# Patient Record
Sex: Female | Born: 1980 | Race: Black or African American | Hispanic: No | Marital: Single | Smoking: Never smoker
Health system: Southern US, Community
[De-identification: ages and names within clinical notes are randomized; demographics above are authoritative.]

## PROBLEM LIST (undated history)

## (undated) DIAGNOSIS — R7303 Prediabetes: Secondary | ICD-10-CM

## (undated) DIAGNOSIS — Z9109 Other allergy status, other than to drugs and biological substances: Secondary | ICD-10-CM

## (undated) DIAGNOSIS — Z8489 Family history of other specified conditions: Secondary | ICD-10-CM

## (undated) DIAGNOSIS — J45909 Unspecified asthma, uncomplicated: Secondary | ICD-10-CM

## (undated) DIAGNOSIS — T8859XA Other complications of anesthesia, initial encounter: Secondary | ICD-10-CM

## (undated) DIAGNOSIS — K219 Gastro-esophageal reflux disease without esophagitis: Secondary | ICD-10-CM

## (undated) DIAGNOSIS — I8392 Asymptomatic varicose veins of left lower extremity: Secondary | ICD-10-CM

## (undated) DIAGNOSIS — L309 Dermatitis, unspecified: Secondary | ICD-10-CM

## (undated) DIAGNOSIS — M1990 Unspecified osteoarthritis, unspecified site: Secondary | ICD-10-CM

## (undated) DIAGNOSIS — T4145XA Adverse effect of unspecified anesthetic, initial encounter: Secondary | ICD-10-CM

---

## 2002-08-26 ENCOUNTER — Emergency Department (HOSPITAL_COMMUNITY): Admission: EM | Admit: 2002-08-26 | Discharge: 2002-08-26 | Payer: Self-pay | Admitting: Emergency Medicine

## 2010-05-27 ENCOUNTER — Encounter: Admission: RE | Admit: 2010-05-27 | Discharge: 2010-05-27 | Payer: Self-pay | Admitting: Obstetrics and Gynecology

## 2010-09-28 HISTORY — PX: CERVICAL BIOPSY  W/ LOOP ELECTRODE EXCISION: SUR135

## 2010-10-14 ENCOUNTER — Ambulatory Visit (HOSPITAL_COMMUNITY): Admission: RE | Admit: 2010-10-14 | Discharge: 2010-10-14 | Payer: Self-pay | Admitting: Obstetrics and Gynecology

## 2011-02-09 LAB — CBC
HCT: 38.2 % (ref 36.0–46.0)
Hemoglobin: 13.1 g/dL (ref 12.0–15.0)
MCH: 30.1 pg (ref 26.0–34.0)
MCV: 87.8 fL (ref 78.0–100.0)
RDW: 13.4 % (ref 11.5–15.5)
WBC: 2.5 10*3/uL — ABNORMAL LOW (ref 4.0–10.5)

## 2012-06-01 ENCOUNTER — Other Ambulatory Visit: Payer: Self-pay | Admitting: Obstetrics and Gynecology

## 2012-12-31 ENCOUNTER — Encounter: Payer: Self-pay | Admitting: Obstetrics and Gynecology

## 2012-12-31 ENCOUNTER — Ambulatory Visit: Payer: BC Managed Care – PPO | Admitting: Obstetrics and Gynecology

## 2012-12-31 VITALS — BP 118/80 | Resp 14 | Ht 68.5 in | Wt 342.0 lb

## 2012-12-31 DIAGNOSIS — Z124 Encounter for screening for malignant neoplasm of cervix: Secondary | ICD-10-CM

## 2012-12-31 DIAGNOSIS — Z3040 Encounter for surveillance of contraceptives, unspecified: Secondary | ICD-10-CM

## 2012-12-31 DIAGNOSIS — Z01419 Encounter for gynecological examination (general) (routine) without abnormal findings: Secondary | ICD-10-CM

## 2012-12-31 DIAGNOSIS — N915 Oligomenorrhea, unspecified: Secondary | ICD-10-CM

## 2012-12-31 DIAGNOSIS — N926 Irregular menstruation, unspecified: Secondary | ICD-10-CM

## 2012-12-31 DIAGNOSIS — N898 Other specified noninflammatory disorders of vagina: Secondary | ICD-10-CM

## 2012-12-31 LAB — POCT WET PREP (WET MOUNT)

## 2012-12-31 LAB — POCT URINE PREGNANCY: Preg Test, Ur: NEGATIVE

## 2012-12-31 MED ORDER — VALACYCLOVIR HCL 500 MG PO TABS: 500.0000 mg | ORAL_TABLET | Freq: Two times a day (BID) | ORAL | Status: AC

## 2012-12-31 NOTE — Addendum Note (Signed)
Addended by: Marla Roe A on: 12/31/2012 04:09 PM   Modules accepted: Orders

## 2012-12-31 NOTE — Addendum Note (Signed)
Addended by: Marla Roe A on: 12/31/2012 03:59 PM   Modules accepted: Orders

## 2012-12-31 NOTE — Progress Notes (Signed)
Patient ID: Martha Dominguez, female   DOB: 1981-01-10, 32 y.o.   MRN: [id number] Contraception None Last pap 6 mos ago per pt./ will verify with paper chart (10/2011 pap wnl.)  Last Mammo never Last Colonoscopy never Last Dexa Scan never Primary MD Cannot recall name Abuse at Home none  C/o ? Developing yeast infxn has been on abx for ?folliculitis.  Says irritation is related to soap - rec change.  Filed Vitals:   12/31/12 1449  BP: 118/80  Resp: 14   ROS: noncontributory  Physical Examination: General appearance - alert, well appearing, and in no distress Neck - supple, no significant adenopathy Chest - clear to auscultation, no wheezes, rales or rhonchi, symmetric air entry Heart - normal rate and regular rhythm Abdomen - soft, nontender, nondistended, no masses or organomegaly Breasts - breasts appear normal, no suspicious masses, no skin or nipple changes or axillary nodes Pelvic - normal external genitalia, vulva, vagina, cervix, uterus and adnexa, difficult secondary to habitus Back exam - no CVAT Extremities - no edema, redness or tenderness in the calves or thighs  A/P Morbidly obese - wt loss Wet prep - neg except inc ph - trial of rephresh Irregular cycle - sched u/s to reeval em lining - previous nl w/u per pt - however 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

## 2013-01-02 LAB — PAP IG, CT-NG, RFX HPV ASCU
Chlamydia Probe Amp: NEGATIVE
GC Probe Amp: NEGATIVE

## 2013-01-07 ENCOUNTER — Encounter: Payer: Self-pay | Admitting: Obstetrics and Gynecology

## 2013-02-11 ENCOUNTER — Ambulatory Visit: Payer: BC Managed Care – PPO

## 2013-02-11 ENCOUNTER — Other Ambulatory Visit: Payer: Self-pay | Admitting: Obstetrics and Gynecology

## 2013-02-11 ENCOUNTER — Encounter: Payer: Self-pay | Admitting: Obstetrics and Gynecology

## 2013-02-11 ENCOUNTER — Ambulatory Visit: Payer: BC Managed Care – PPO | Admitting: Obstetrics and Gynecology

## 2013-02-11 VITALS — BP 110/82 | Wt 341.0 lb

## 2013-02-11 LAB — CBC
Platelets: 293 10*3/uL (ref 150–400)
RBC: 4.41 MIL/uL (ref 3.87–5.11)
WBC: 5 10*3/uL (ref 4.0–10.5)

## 2013-02-11 MED ORDER — LEVONORGESTREL 13.5 MG IU IUD
INTRAUTERINE_SYSTEM | Freq: Once | INTRAUTERINE | Status: AC
  Administered 2013-02-11: 1 via INTRAUTERINE

## 2013-02-11 NOTE — Progress Notes (Signed)
IUD: Skyla UPT: negative GC/CHLAMYDIA: negative CONSENT SIGNED: yes DISINFECTION WITH betadine X3 UTERUS SOUNDED AT 7.5 CM IUD INSERTED PER PROTOCOL: yes COMPLICATION: no PATIENT INSTRUCTED TO CALL IS FEVER OR ABNORMAL PAIN:yes PATIENT INSTRUCTED ON HOW TO CHECK IUD STRINGS: yes FOLLOW UP APPT: 5wks  U/S - Ut 2.9 x 5.5 x 3.9, nl bilateral ovaries lt 2.2cm, rt 2.6cm  Check TSH, PRL and CBC secondary to menometrorrhagia

## 2017-07-04 ENCOUNTER — Other Ambulatory Visit: Payer: Self-pay | Admitting: Dermatology

## 2017-09-03 ENCOUNTER — Emergency Department (HOSPITAL_COMMUNITY)
Admission: EM | Admit: 2017-09-03 | Discharge: 2017-09-03 | Disposition: A | Discharge status: Home / Self Care | Payer: BLUE CROSS/BLUE SHIELD | Attending: Emergency Medicine | Admitting: Emergency Medicine

## 2017-09-03 ENCOUNTER — Emergency Department (HOSPITAL_COMMUNITY): Payer: BLUE CROSS/BLUE SHIELD

## 2017-09-03 ENCOUNTER — Encounter (HOSPITAL_COMMUNITY): Payer: Self-pay | Admitting: Emergency Medicine

## 2017-09-03 DIAGNOSIS — Y999 Unspecified external cause status: Secondary | ICD-10-CM | POA: Insufficient documentation

## 2017-09-03 DIAGNOSIS — R55 Syncope and collapse: Secondary | ICD-10-CM | POA: Insufficient documentation

## 2017-09-03 DIAGNOSIS — Y92002 Bathroom of unspecified non-institutional (private) residence single-family (private) house as the place of occurrence of the external cause: Secondary | ICD-10-CM | POA: Insufficient documentation

## 2017-09-03 DIAGNOSIS — S52501A Unspecified fracture of the lower end of right radius, initial encounter for closed fracture: Secondary | ICD-10-CM | POA: Insufficient documentation

## 2017-09-03 DIAGNOSIS — W182XXA Fall in (into) shower or empty bathtub, initial encounter: Secondary | ICD-10-CM | POA: Insufficient documentation

## 2017-09-03 DIAGNOSIS — S6991XA Unspecified injury of right wrist, hand and finger(s), initial encounter: Secondary | ICD-10-CM | POA: Diagnosis present

## 2017-09-03 DIAGNOSIS — Y93E1 Activity, personal bathing and showering: Secondary | ICD-10-CM | POA: Insufficient documentation

## 2017-09-03 LAB — CBC WITH DIFFERENTIAL/PLATELET
BASOS ABS: 0 10*3/uL (ref 0.0–0.1)
BASOS PCT: 0 %
EOS ABS: 0 10*3/uL (ref 0.0–0.7)
EOS PCT: 1 %
HCT: 39.2 % (ref 36.0–46.0)
Hemoglobin: 13.1 g/dL (ref 12.0–15.0)
LYMPHS PCT: 30 %
Lymphs Abs: 1.2 10*3/uL (ref 0.7–4.0)
MCH: 30.3 pg (ref 26.0–34.0)
MCHC: 33.4 g/dL (ref 30.0–36.0)
MCV: 90.7 fL (ref 78.0–100.0)
MONO ABS: 0.2 10*3/uL (ref 0.1–1.0)
Monocytes Relative: 5 %
Neutro Abs: 2.6 10*3/uL (ref 1.7–7.7)
Neutrophils Relative %: 64 %
PLATELETS: 225 10*3/uL (ref 150–400)
RBC: 4.32 MIL/uL (ref 3.87–5.11)
RDW: 12.8 % (ref 11.5–15.5)
WBC: 4.1 10*3/uL (ref 4.0–10.5)

## 2017-09-03 LAB — BASIC METABOLIC PANEL
Anion gap: 6 (ref 5–15)
BUN: 11 mg/dL (ref 6–20)
CALCIUM: 8.5 mg/dL — AB (ref 8.9–10.3)
CHLORIDE: 105 mmol/L (ref 101–111)
CO2: 29 mmol/L (ref 22–32)
CREATININE: 0.79 mg/dL (ref 0.44–1.00)
GLUCOSE: 127 mg/dL — AB (ref 65–99)
Potassium: 4.8 mmol/L (ref 3.5–5.1)
SODIUM: 140 mmol/L (ref 135–145)

## 2017-09-03 MED ORDER — OXYCODONE-ACETAMINOPHEN 5-325 MG PO TABS
1.0000 | ORAL_TABLET | Freq: Once | ORAL | Status: AC
  Administered 2017-09-03: 1 via ORAL

## 2017-09-03 MED ORDER — OXYCODONE-ACETAMINOPHEN 5-325 MG PO TABS
1.0000 | ORAL_TABLET | Freq: Once | ORAL | Status: DC
  Filled 2017-09-03: qty 1

## 2017-09-03 MED ORDER — OXYCODONE-ACETAMINOPHEN 5-325 MG PO TABS
1.0000 | ORAL_TABLET | Freq: Once | ORAL | Status: AC
  Administered 2017-09-03: 1 via ORAL
  Filled 2017-09-03: qty 1

## 2017-09-03 MED ORDER — OXYCODONE-ACETAMINOPHEN 5-325 MG PO TABS: 1.0000 | ORAL_TABLET | ORAL | 0 refills | Status: AC | PRN

## 2017-09-03 NOTE — ED Triage Notes (Signed)
Pt reports she fell in the shower this am and injured her R wrist. Pt has R wrist deformity.

## 2017-09-03 NOTE — ED Provider Notes (Signed)
[facility]-EMERGENCY DEPT Provider Note   CSN: [id number] Arrival date & time: 09/03/17  0906     History   Chief Complaint Chief Complaint  Patient presents with  . Wrist Injury    HPI Martha Dominguez is a 36 y.o. right hand dominant female who presents with a right wrist injury. No significant PMH. She states that she was getting in to the tub this morning and slipped and fell with an outstretched hand on to her right side. She initially had pain from her right shoulder down but now it is mostly in her right wrist and radiates to her hand and elbow. She reports associated swelling and some numbness of the thumb but is able to move all her fingers. In the waiting room she became hot, diaphoretic, and lightheaded and passed out for < 1 minute as well. 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 She denies LOC, headache, dizziness, vision changes, chest pain, SOB, abdominal pain, N/V, numbness/tingling or weakness in the arms or legs. He has been able to ambulate without difficulty.   HPI  History reviewed. No pertinent past medical history.  Patient Active Problem List   Diagnosis Date Noted  . Encounter for IUD insertion - Skyla inserted 02/11/13 02/11/2013    History reviewed. No pertinent surgical history.  OB History    No data available       Home Medications    Prior to Admission medications   Medication Sig Start Date End Date Taking? Authorizing Provider  cephALEXin (KEFLEX) 500 MG capsule Take 500 mg by mouth 4 (four) times daily.    [provider]  valACYclovir (VALTREX) 500 MG tablet TAKE 1 TABLET BY MOUTH DAILY 06/01/12   Kirkland Hun, MD  valACYclovir (VALTREX) 500 MG tablet Take 1 tablet (500 mg total) by mouth 2 (two) times daily. 12/31/12   Osborn Coho, MD    Family History History reviewed. No pertinent family history.  Social History Social History  Substance Use Topics  . Smoking status: Never Smoker  . Smokeless tobacco: Never Used    . Alcohol use No     Allergies   Latex and Aleve [naproxen sodium]   Review of Systems Review of Systems  Constitutional: Positive for diaphoresis.  Respiratory: Negative for shortness of breath.   Cardiovascular: Negative for chest pain.  Gastrointestinal: Negative for abdominal pain, nausea and vomiting.  Musculoskeletal: Positive for arthralgias. Negative for gait problem and neck pain.  Neurological: Positive for syncope and numbness. Negative for weakness and headaches.  All other systems reviewed and are negative.    Physical Exam Updated Vital Signs BP 111/61 (BP Location: Left Arm)   Pulse (!) 54   Temp 98.2 F (36.8 C) (Oral)   Resp 16   Ht 5' 8.5" (1.74 m)   Wt (!) 154.2 kg (340 lb)   LMP 08/27/2017 (Approximate)   SpO2 96%   BMI 50.94 kg/m   Physical Exam  Constitutional: She is oriented to person, place, and time. She appears well-developed and well-nourished. No distress.  Obese, NAD  HENT:  Head: Normocephalic and atraumatic.  Eyes: Pupils are equal, round, and reactive to light. Conjunctivae are normal. Right eye exhibits no discharge. Left eye exhibits no discharge. No scleral icterus.  Neck: Normal range of motion.  No midline tenderness  Cardiovascular: Normal rate and regular rhythm.  Exam reveals no gallop and no friction rub.   No murmur heard. Pulmonary/Chest: Effort normal and breath sounds normal. No  respiratory distress. She has no wheezes. She has no rales. She exhibits no tenderness.  Abdominal: She exhibits no distension.  Musculoskeletal:  Right upper extremity: No obvious swelling, deformity, or tenderness of shoulder or elbow. Tenderness to palpation of lateral wrist with obvious deformity and swelling. ROM of elbow and wrist deferred. Able to wiggle all fingers. N/V intact.   Neurological: She is alert and oriented to person, place, and time.  Skin: Skin is warm and dry.  Psychiatric: She has a normal mood and affect. Her behavior  is normal.  Nursing note and vitals reviewed.    ED Treatments / Results  Labs (all labs ordered are listed, but only abnormal results are displayed) Labs Reviewed  BASIC METABOLIC PANEL - Abnormal; Notable for the following:       Result Value   Glucose, Bld 127 (*)    Calcium 8.5 (*)    All other components within normal limits  CBC WITH DIFFERENTIAL/PLATELET    EKG  EKG Interpretation None       Radiology Dg Wrist Complete Right  Result Date: 09/03/2017 CLINICAL DATA:  36 year old female with history of trauma from a fall this morning. Right wrist pain and deformity. EXAM: RIGHT WRIST - COMPLETE 3+ VIEW COMPARISON:  No priors. FINDINGS: There is a mildly comminuted displaced fracture of the distal radial metaphysis with extension to the articular surface. Approximately 7 mm of ulnar displacement and 5 mm of dorsal displacement is noted at site of fracture. Distal ulna appears intact. Carpals appear intact. Overlying soft tissues are markedly swollen. IMPRESSION: 1. Acute mildly comminuted intra-articular fracture of the distal radius, as above. Electronically Signed   By: Trudie Reed M.D.   On: 09/03/2017 10:08    Procedures Procedures (including critical care time)  SPLINT APPLICATION Date/Time: 09/03/17 12:41 PM Authorized by: Bethel Born Consent: Verbal consent obtained. Risks and benefits: risks, benefits and alternatives were discussed Consent given by: patient Splint applied by: orthopedic technician Location details: Right wrist Splint type: Sugar tong Supplies used: Pre-fabricated  Post-procedure: The splinted body part was neurovascularly unchanged following the procedure. Patient tolerance: Patient tolerated the procedure well with no immediate complications.   Medications Ordered in ED Medications  oxyCODONE-acetaminophen (PERCOCET/ROXICET) 5-325 MG per tablet 1 tablet (1 tablet Oral Given 09/03/17 1022)  oxyCODONE-acetaminophen  (PERCOCET/ROXICET) 5-325 MG per tablet 1 tablet (1 tablet Oral Given 09/03/17 1117)     Initial Impression / Assessment and Plan / ED Course  I have reviewed the triage vital signs and the nursing notes.  Pertinent labs & imaging results that were available during my care of the patient were reviewed by me and considered in my medical decision making (see chart for details).  36 year old female with distal radius fracture. Vitals are normal. She also had an episode of syncope in the waiting room. She described predromal symptoms - likely vasovagal due to pain. Labs and EKG obtained were normal. Xray shows displaced radial fracture. Dr. Jena Gauss with hand surgery was consulted who recommends sugar tong splint and office f/u - will likely need surgical fixation. Pain was managed in the ED. Discussed rest, ice, elevation, and pain management with patient. She will call ortho office tomorrow. Return precautions given.  Final Clinical Impressions(s) / ED Diagnoses   Final diagnoses:  Closed fracture of distal end of right radius, unspecified fracture morphology, initial encounter    New Prescriptions New Prescriptions   No medications on file     Bethel Born, PA-C  09/03/17 1246    Linwood Dibbles, MD 09/03/17 1620

## 2017-09-03 NOTE — ED Notes (Signed)
Ortho tech not available until noon.

## 2017-09-03 NOTE — ED Notes (Signed)
Attempted to make pt more comfortable, pt states in a lot of pain.

## 2017-09-03 NOTE — Discharge Instructions (Signed)
Rest - Avoid movement of wrist Ice - ice for 20 minutes at a time, several times a day Elevate - to reduce swelling Ibuprofen - take with food. Take up to 3-4 times daily Take Percocet as needed for pain. Do not drive or drink alcohol while taking this medicine Call Dr. Luvenia Starch office Monday morning for appointment

## 2017-09-03 NOTE — ED Notes (Signed)
Tech bedside for blood draw

## 2017-09-06 ENCOUNTER — Other Ambulatory Visit: Payer: Self-pay | Admitting: Orthopedic Surgery

## 2017-09-07 ENCOUNTER — Encounter (HOSPITAL_COMMUNITY): Payer: Self-pay | Admitting: *Deleted

## 2017-09-08 ENCOUNTER — Encounter (HOSPITAL_COMMUNITY)
Admission: RE | Disposition: A | Discharge status: Home / Self Care | Payer: Self-pay | Source: Ambulatory Visit | Attending: Orthopedic Surgery

## 2017-09-08 ENCOUNTER — Ambulatory Visit (HOSPITAL_COMMUNITY): Payer: BLUE CROSS/BLUE SHIELD | Admitting: Anesthesiology

## 2017-09-08 ENCOUNTER — Ambulatory Visit (HOSPITAL_COMMUNITY)
Admission: RE | Admit: 2017-09-08 | Discharge: 2017-09-08 | Disposition: A | Discharge status: Home / Self Care | Payer: BLUE CROSS/BLUE SHIELD | Source: Ambulatory Visit | Attending: Orthopedic Surgery | Admitting: Orthopedic Surgery

## 2017-09-08 DIAGNOSIS — Z7984 Long term (current) use of oral hypoglycemic drugs: Secondary | ICD-10-CM | POA: Diagnosis not present

## 2017-09-08 DIAGNOSIS — S52571A Other intraarticular fracture of lower end of right radius, initial encounter for closed fracture: Secondary | ICD-10-CM | POA: Insufficient documentation

## 2017-09-08 DIAGNOSIS — W19XXXA Unspecified fall, initial encounter: Secondary | ICD-10-CM | POA: Diagnosis not present

## 2017-09-08 DIAGNOSIS — Z6841 Body Mass Index (BMI) 40.0 and over, adult: Secondary | ICD-10-CM | POA: Insufficient documentation

## 2017-09-08 DIAGNOSIS — M25531 Pain in right wrist: Secondary | ICD-10-CM | POA: Diagnosis present

## 2017-09-08 HISTORY — DX: Family history of other specified conditions: Z84.89

## 2017-09-08 HISTORY — DX: Gastro-esophageal reflux disease without esophagitis: K21.9

## 2017-09-08 HISTORY — DX: Adverse effect of unspecified anesthetic, initial encounter: T41.45XA

## 2017-09-08 HISTORY — DX: Unspecified osteoarthritis, unspecified site: M19.90

## 2017-09-08 HISTORY — DX: Other complications of anesthesia, initial encounter: T88.59XA

## 2017-09-08 HISTORY — PX: CARPAL TUNNEL RELEASE: SHX101

## 2017-09-08 HISTORY — DX: Prediabetes: R73.03

## 2017-09-08 HISTORY — PX: OPEN REDUCTION INTERNAL FIXATION (ORIF) DISTAL RADIAL FRACTURE: SHX5989

## 2017-09-08 HISTORY — DX: Dermatitis, unspecified: L30.9

## 2017-09-08 HISTORY — DX: Unspecified asthma, uncomplicated: J45.909

## 2017-09-08 HISTORY — DX: Asymptomatic varicose veins of left lower extremity: I83.92

## 2017-09-08 HISTORY — DX: Other allergy status, other than to drugs and biological substances: Z91.09

## 2017-09-08 LAB — I-STAT BETA HCG BLOOD, ED (NOT ORDERABLE): I-stat hCG, quantitative: <5 m[IU]/mL (ref <5)

## 2017-09-08 LAB — HCG, SERUM, QUALITATIVE: Preg, Serum: NEGATIVE

## 2017-09-08 LAB — HEMOGLOBIN A1C
HEMOGLOBIN A1C: 5.2 % (ref 4.8–5.6)
Mean Plasma Glucose: 102.54 mg/dL

## 2017-09-08 LAB — GLUCOSE, CAPILLARY
Glucose-Capillary: 66 mg/dL (ref 65–99)
Glucose-Capillary: 61 mg/dL — ABNORMAL LOW (ref 65–99)
Glucose-Capillary: 75 mg/dL (ref 65–99)

## 2017-09-08 SURGERY — OPEN REDUCTION INTERNAL FIXATION (ORIF) DISTAL RADIUS FRACTURE
Anesthesia: Regional | Laterality: Right

## 2017-09-08 MED ORDER — ONDANSETRON HCL 4 MG/2ML IJ SOLN
INTRAMUSCULAR | Status: DC | PRN
  Administered 2017-09-08: 4 mg via INTRAVENOUS

## 2017-09-08 MED ORDER — PHENYLEPHRINE 40 MCG/ML (10ML) SYRINGE FOR IV PUSH (FOR BLOOD PRESSURE SUPPORT)
PREFILLED_SYRINGE | INTRAVENOUS | Status: DC | PRN
  Administered 2017-09-08 (×2): 80 ug via INTRAVENOUS

## 2017-09-08 MED ORDER — SODIUM CHLORIDE 0.9 % IJ SOLN
INTRAMUSCULAR | Status: AC
  Filled 2017-09-08: qty 10

## 2017-09-08 MED ORDER — MIDAZOLAM HCL 2 MG/2ML IJ SOLN
INTRAMUSCULAR | Status: AC
  Filled 2017-09-08: qty 2

## 2017-09-08 MED ORDER — CEFAZOLIN SODIUM-DEXTROSE 2-4 GM/100ML-% IV SOLN
2.0000 g | INTRAVENOUS | Status: AC
  Administered 2017-09-08: 3 g via INTRAVENOUS

## 2017-09-08 MED ORDER — ROPIVACAINE HCL 5 MG/ML IJ SOLN
INTRAMUSCULAR | Status: DC | PRN
  Administered 2017-09-08: 30 mL via PERINEURAL

## 2017-09-08 MED ORDER — PHENYLEPHRINE 40 MCG/ML (10ML) SYRINGE FOR IV PUSH (FOR BLOOD PRESSURE SUPPORT)
PREFILLED_SYRINGE | INTRAVENOUS | Status: AC
  Filled 2017-09-08: qty 10

## 2017-09-08 MED ORDER — SCOPOLAMINE 1 MG/3DAYS TD PT72
MEDICATED_PATCH | TRANSDERMAL | Status: DC | PRN
  Administered 2017-09-08: 1 via TRANSDERMAL

## 2017-09-08 MED ORDER — CEFAZOLIN SODIUM-DEXTROSE 2-4 GM/100ML-% IV SOLN
INTRAVENOUS | Status: AC
  Filled 2017-09-08: qty 100

## 2017-09-08 MED ORDER — MIDAZOLAM HCL 5 MG/5ML IJ SOLN
INTRAMUSCULAR | Status: DC | PRN
  Administered 2017-09-08: 2 mg via INTRAVENOUS

## 2017-09-08 MED ORDER — ONDANSETRON HCL 4 MG/2ML IJ SOLN
INTRAMUSCULAR | Status: AC
  Filled 2017-09-08: qty 2

## 2017-09-08 MED ORDER — FENTANYL CITRATE (PF) 100 MCG/2ML IJ SOLN
50.0000 ug | Freq: Once | INTRAMUSCULAR | Status: AC
  Administered 2017-09-08: 50 ug via INTRAVENOUS

## 2017-09-08 MED ORDER — SCOPOLAMINE 1 MG/3DAYS TD PT72
MEDICATED_PATCH | TRANSDERMAL | Status: AC
  Filled 2017-09-08: qty 1

## 2017-09-08 MED ORDER — CHLORHEXIDINE GLUCONATE 4 % EX LIQD: 60.0000 mL | Freq: Once | CUTANEOUS | Status: DC

## 2017-09-08 MED ORDER — 0.9 % SODIUM CHLORIDE (POUR BTL) OPTIME
TOPICAL | Status: DC | PRN
  Administered 2017-09-08: 1000 mL

## 2017-09-08 MED ORDER — FENTANYL CITRATE (PF) 250 MCG/5ML IJ SOLN
INTRAMUSCULAR | Status: DC | PRN
  Administered 2017-09-08 (×2): 25 ug via INTRAVENOUS
  Administered 2017-09-08: 50 ug via INTRAVENOUS

## 2017-09-08 MED ORDER — MIDAZOLAM HCL 2 MG/2ML IJ SOLN
INTRAMUSCULAR | Status: AC
  Administered 2017-09-08: 1 mg via INTRAVENOUS
  Filled 2017-09-08: qty 2

## 2017-09-08 MED ORDER — FENTANYL CITRATE (PF) 100 MCG/2ML IJ SOLN: 25.0000 ug | INTRAMUSCULAR | Status: DC | PRN

## 2017-09-08 MED ORDER — PROPOFOL 10 MG/ML IV BOLUS
INTRAVENOUS | Status: AC
  Filled 2017-09-08: qty 20

## 2017-09-08 MED ORDER — LIDOCAINE 2% (20 MG/ML) 5 ML SYRINGE
INTRAMUSCULAR | Status: AC
  Filled 2017-09-08: qty 5

## 2017-09-08 MED ORDER — DEXAMETHASONE SODIUM PHOSPHATE 10 MG/ML IJ SOLN
INTRAMUSCULAR | Status: AC
  Filled 2017-09-08: qty 1

## 2017-09-08 MED ORDER — FENTANYL CITRATE (PF) 100 MCG/2ML IJ SOLN
INTRAMUSCULAR | Status: AC
  Administered 2017-09-08: 50 ug via INTRAVENOUS
  Filled 2017-09-08: qty 2

## 2017-09-08 MED ORDER — ONDANSETRON HCL 4 MG/2ML IJ SOLN: 4.0000 mg | Freq: Once | INTRAMUSCULAR | Status: DC | PRN

## 2017-09-08 MED ORDER — EPHEDRINE 5 MG/ML INJ
INTRAVENOUS | Status: AC
  Filled 2017-09-08: qty 10

## 2017-09-08 MED ORDER — PROPOFOL 10 MG/ML IV BOLUS
INTRAVENOUS | Status: DC | PRN
Start: 2017-09-08 — End: 2017-09-08
  Administered 2017-09-08: 300 mg via INTRAVENOUS

## 2017-09-08 MED ORDER — DEXAMETHASONE SODIUM PHOSPHATE 10 MG/ML IJ SOLN
INTRAMUSCULAR | Status: DC | PRN
  Administered 2017-09-08: 5 mg via INTRAVENOUS

## 2017-09-08 MED ORDER — LIDOCAINE 2% (20 MG/ML) 5 ML SYRINGE
INTRAMUSCULAR | Status: DC | PRN
  Administered 2017-09-08: 40 mg via INTRAVENOUS

## 2017-09-08 MED ORDER — OXYCODONE-ACETAMINOPHEN 5-325 MG PO TABS: 1.0000 | ORAL_TABLET | ORAL | 0 refills | Status: AC | PRN

## 2017-09-08 MED ORDER — FENTANYL CITRATE (PF) 250 MCG/5ML IJ SOLN
INTRAMUSCULAR | Status: AC
  Filled 2017-09-08: qty 5

## 2017-09-08 MED ORDER — MIDAZOLAM HCL 2 MG/2ML IJ SOLN
1.0000 mg | Freq: Once | INTRAMUSCULAR | Status: AC
  Administered 2017-09-08: 1 mg via INTRAVENOUS

## 2017-09-08 MED ORDER — EPHEDRINE SULFATE-NACL 50-0.9 MG/10ML-% IV SOSY
PREFILLED_SYRINGE | INTRAVENOUS | Status: DC | PRN
  Administered 2017-09-08: 5 mg via INTRAVENOUS

## 2017-09-08 MED ORDER — CEFAZOLIN SODIUM 1 G IJ SOLR
INTRAMUSCULAR | Status: AC
  Filled 2017-09-08: qty 10

## 2017-09-08 MED ORDER — LACTATED RINGERS IV SOLN
INTRAVENOUS | Status: DC
  Administered 2017-09-08: 14:00:00 via INTRAVENOUS
  Administered 2017-09-08: 50 mL/h via INTRAVENOUS

## 2017-09-08 SURGICAL SUPPLY — 65 items
APL SKNCLS STERI-STRIP NONHPOA (GAUZE/BANDAGES/DRESSINGS) ×1
BANDAGE ACE 3X5.8 VEL STRL LF (GAUZE/BANDAGES/DRESSINGS) ×3 IMPLANT
BANDAGE ACE 4X5 VEL STRL LF (GAUZE/BANDAGES/DRESSINGS) ×3 IMPLANT
BENZOIN TINCTURE PRP APPL 2/3 (GAUZE/BANDAGES/DRESSINGS) ×2 IMPLANT
BIT DRILL 2 FAST STEP (BIT) ×2 IMPLANT
BIT DRILL 2.5X4 QC (BIT) ×2 IMPLANT
BNDG CMPR 9X4 STRL LF SNTH (GAUZE/BANDAGES/DRESSINGS) ×1
BNDG ESMARK 4X9 LF (GAUZE/BANDAGES/DRESSINGS) ×3 IMPLANT
BNDG GAUZE ELAST 4 BULKY (GAUZE/BANDAGES/DRESSINGS) ×4 IMPLANT
CLOSURE WOUND 1/2 X4 (GAUZE/BANDAGES/DRESSINGS) ×1
CORDS BIPOLAR (ELECTRODE) ×3 IMPLANT
COVER MAYO STAND STRL (DRAPES) IMPLANT
COVER SURGICAL LIGHT HANDLE (MISCELLANEOUS) ×3 IMPLANT
CUFF TOURNIQUET SINGLE 18IN (TOURNIQUET CUFF) ×3 IMPLANT
CUFF TOURNIQUET SINGLE 24IN (TOURNIQUET CUFF) IMPLANT
DRAPE C-ARM MINI 42X72 WSTRAPS (DRAPES) ×3 IMPLANT
DRAPE OEC MINIVIEW 54X84 (DRAPES) IMPLANT
DRAPE SURG 17X23 STRL (DRAPES) ×3 IMPLANT
DURAPREP 26ML APPLICATOR (WOUND CARE) ×3 IMPLANT
GAUZE SPONGE 4X4 12PLY STRL (GAUZE/BANDAGES/DRESSINGS) ×3 IMPLANT
GAUZE XEROFORM 1X8 LF (GAUZE/BANDAGES/DRESSINGS) ×3 IMPLANT
GLOVE SURG SYN 8.0 (GLOVE) ×3 IMPLANT
GLOVE SURG SYN 8.0 PF PI (GLOVE) ×1 IMPLANT
GOWN STRL REUS W/ TWL LRG LVL3 (GOWN DISPOSABLE) ×1 IMPLANT
GOWN STRL REUS W/ TWL XL LVL3 (GOWN DISPOSABLE) ×1 IMPLANT
GOWN STRL REUS W/TWL 2XL LVL3 (GOWN DISPOSABLE) ×3 IMPLANT
GOWN STRL REUS W/TWL LRG LVL3 (GOWN DISPOSABLE) ×3
GOWN STRL REUS W/TWL XL LVL3 (GOWN DISPOSABLE) ×3
HOVERMATT SINGLE USE (MISCELLANEOUS) ×2 IMPLANT
KIT BASIN OR (CUSTOM PROCEDURE TRAY) ×3 IMPLANT
KIT ROOM TURNOVER OR (KITS) ×3 IMPLANT
MANIFOLD NEPTUNE II (INSTRUMENTS) ×3 IMPLANT
NDL HYPO 25GX1X1/2 BEV (NEEDLE) IMPLANT
NEEDLE HYPO 25GX1X1/2 BEV (NEEDLE) IMPLANT
NS IRRIG 1000ML POUR BTL (IV SOLUTION) ×3 IMPLANT
PACK ORTHO EXTREMITY (CUSTOM PROCEDURE TRAY) ×3 IMPLANT
PAD ARMBOARD 7.5X6 YLW CONV (MISCELLANEOUS) ×6 IMPLANT
PAD CAST 4YDX4 CTTN HI CHSV (CAST SUPPLIES) ×1 IMPLANT
PADDING CAST COTTON 4X4 STRL (CAST SUPPLIES) ×3
PEG SUBCHONDRAL SMOOTH 2.0X24 (Peg) ×2 IMPLANT
PEG SUBCHONDRAL SMOOTH 2.0X26 (Peg) ×6 IMPLANT
PEG THREADED 2.5MMX18MM LONG (Peg) ×4 IMPLANT
PEG THREADED 2.5MMX22MM LONG (Peg) ×2 IMPLANT
PENCIL BUTTON HOLSTER BLD 10FT (ELECTRODE) IMPLANT
PLATE STAN 24.4X59.5 RT (Plate) ×2 IMPLANT
SCREW BN 13X3.5XNS CORT TI (Screw) IMPLANT
SCREW CORT 3.5X13 (Screw) ×3 IMPLANT
SCREW CORT 3.5X14 LNG (Screw) ×2 IMPLANT
SCREW CORT 3.5X16 LNG (Screw) ×2 IMPLANT
SPLINT FIBERGLASS 4X15 (CAST SUPPLIES) ×2 IMPLANT
SPONGE LAP 4X18 X RAY DECT (DISPOSABLE) IMPLANT
STRIP CLOSURE SKIN 1/2X4 (GAUZE/BANDAGES/DRESSINGS) ×2 IMPLANT
SUT ETHILON 4 0 PS 2 18 (SUTURE) IMPLANT
SUT PROLENE 3 0 PS 1 (SUTURE) ×2 IMPLANT
SUT PROLENE 3 0 PS 2 (SUTURE) IMPLANT
SUT VIC AB 2-0 FS1 27 (SUTURE) ×2 IMPLANT
SUT VIC AB 3-0 FS2 27 (SUTURE) IMPLANT
SUT VIC AB 3-0 PS2 18 (SUTURE)
SUT VIC AB 3-0 PS2 18XBRD (SUTURE) IMPLANT
SUT VICRYL 4-0 PS2 18IN ABS (SUTURE) IMPLANT
SYR CONTROL 10ML LL (SYRINGE) IMPLANT
TOWEL OR 17X24 6PK STRL BLUE (TOWEL DISPOSABLE) ×3 IMPLANT
TOWEL OR 17X26 10 PK STRL BLUE (TOWEL DISPOSABLE) ×3 IMPLANT
UNDERPAD 30X30 (UNDERPADS AND DIAPERS) ×3 IMPLANT
WATER STERILE IRR 1000ML POUR (IV SOLUTION) ×3 IMPLANT

## 2017-09-08 NOTE — Op Note (Signed)
I assisted Surgeon(s) and Role:    * Dairl Ponder, MD - Primary    Betha Loa, MD - Assisting on the Procedure(s): OPEN REDUCTION INTERNAL FIXATION (ORIF) RIGHT DISTAL RADIAL FRACTURE RIGHT CARPAL TUNNEL RELEASE on 09/08/2017.  I provided assistance on this case as follows: retraction soft tissues, evaluation radiographs.  Electronically signed by: Tami Ribas, MD Date: 09/08/2017 Time: 1:56 PM

## 2017-09-08 NOTE — Transfer of Care (Signed)
Immediate Anesthesia Transfer of Care Note  Patient: Martha Dominguez  Procedure(s) Performed: OPEN REDUCTION INTERNAL FIXATION (ORIF) RIGHT DISTAL RADIAL FRACTURE (Right ) RIGHT CARPAL TUNNEL RELEASE (Right )  Patient Location: PACU  Anesthesia Type:GA combined with regional for post-op pain  Level of Consciousness: drowsy and patient cooperative  Airway & Oxygen Therapy: Patient Spontanous Breathing and Patient connected to face mask oxygen  Post-op Assessment: Report given to RN and Post -op Vital signs reviewed and stable  Post vital signs: Reviewed and stable  Last Vitals:  Vitals:   09/08/17 1245 09/08/17 1416  BP: (!) 133/46   Pulse: 64   Resp: (!) 22   Temp:  (!) 36.4 C  SpO2:      Last Pain:  Vitals:   09/08/17 1416  TempSrc: Tympanic  PainSc:       Patients Stated Pain Goal: 3 (09/08/17 1040)  Complications: No apparent anesthesia complications

## 2017-09-08 NOTE — Anesthesia Procedure Notes (Signed)
Procedure Name: LMA Insertion Date/Time: 09/08/2017 1:12 PM Performed by: Army Fossa Pre-anesthesia Checklist: Patient identified, Emergency Drugs available, Suction available and Patient being monitored Patient Re-evaluated:Patient Re-evaluated prior to induction Oxygen Delivery Method: Circle System Utilized Preoxygenation: Pre-oxygenation with 100% oxygen Induction Type: IV induction Ventilation: Mask ventilation without difficulty LMA: LMA inserted LMA Size: 5.0 Number of attempts: 1 Airway Equipment and Method: Bite block Placement Confirmation: positive ETCO2 Tube secured with: Tape Dental Injury: Teeth and Oropharynx as per pre-operative assessment

## 2017-09-08 NOTE — Progress Notes (Signed)
Hypoglycemic Event  CBG: 66  Treatment: 2 (4oz) apple juice, graham crackers  Symptoms: None  Follow-up CBG: Time:  CBG Result 75  Possible Reasons for Event: N/A Comments/MD notified:    Nikki Dom

## 2017-09-08 NOTE — Anesthesia Postprocedure Evaluation (Signed)
Anesthesia Post Note  Patient: HOLLY IANNACCONE  Procedure(s) Performed: OPEN REDUCTION INTERNAL FIXATION (ORIF) RIGHT DISTAL RADIAL FRACTURE (Right ) RIGHT CARPAL TUNNEL RELEASE (Right )     Patient location during evaluation: PACU Anesthesia Type: Regional and General Level of consciousness: awake and alert Pain management: pain level controlled Vital Signs Assessment: post-procedure vital signs reviewed and stable Respiratory status: spontaneous breathing, nonlabored ventilation, respiratory function stable and patient connected to nasal cannula oxygen Cardiovascular status: blood pressure returned to baseline and stable Postop Assessment: no apparent nausea or vomiting Anesthetic complications: no    Last Vitals:  Vitals:   09/08/17 1526 09/08/17 1530  BP: 124/81   Pulse: 74 74  Resp: (!) 23 (!) 25  Temp: 36.5 C   SpO2: 100% 100%    Last Pain:  Vitals:   09/08/17 1530  TempSrc:   PainSc: 0-No pain                 Mikai Meints P Ayodele Sangalang

## 2017-09-08 NOTE — Op Note (Signed)
Please see dictated report [phone]

## 2017-09-08 NOTE — Progress Notes (Signed)
Orthopedic Tech Progress Note Patient Details:  Martha Dominguez 18-Nov-1981 [id number]  Ortho Devices Type of Ortho Device: Arm sling Ortho Device/Splint Interventions: Application   Saul Fordyce 09/08/2017, 2:50 PM

## 2017-09-08 NOTE — H&P (Signed)
Martha Dominguez is an 36 y.o. female.   Chief Complaint: Right wrist and hand pain with intermittent numbness and tingling in the median distribution status post a fall onto outstretched right upper extremity. HPI: Patient's a very pleasant 36 year old female status post a fall onto an outstretched right upper extremity with a intra-articular fracture distal radius on the right side with intermittent carpal tunnel like symptoms.  Past Medical History:  Diagnosis Date  . 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 Asthma    allergy related  . Asthma due to seasonal allergies   . Complication of anesthesia    ? was in PACU for 3 hours post op LEEP  . Eczema   . Environmental allergies    dust  . Family history of adverse reaction to anesthesia    Sister- respitory post op- , she has asthma  . GERD (gastroesophageal reflux disease)   . Pre-diabetes   . Varicose veins of left lower extremity     Past Surgical History:  Procedure Laterality Date  . CERVICAL BIOPSY  W/ LOOP ELECTRODE EXCISION  09/2010    Family History  Problem Relation Age of Onset  . COPD Mother   . Hypertension Father   . Asthma Sister   . Hypertension Maternal Grandmother   . Heart attack Other         x 3  . Hypertension Sister    Social History:  reports that she has never smoked. She has never used smokeless tobacco. She reports that she does not drink alcohol or use drugs.  Allergies:  Allergies  Allergen Reactions  . Naproxen Shortness Of Breath and Swelling    Alleve,   . Latex Swelling  . Aleve [Naproxen Sodium] Other (See Comments)    Unknown    Medications Prior to Admission  Medication Sig Dispense Refill  . acetaminophen (TYLENOL) 500 MG tablet Take 1,000 mg by mouth every 6 (six) hours as needed for moderate pain or headache.    . fexofenadine (ALLEGRA) 180 MG tablet Take 180 mg by mouth daily as needed for allergies or rhinitis.    Marland Kitchen ibuprofen (ADVIL,MOTRIN) 200 MG tablet Take 600-800 mg by mouth  every 4 (four) hours as needed for headache or moderate pain.    . Levonorgestrel (SKYLA) 13.5 MG IUD 1 each by Intrauterine route once.    . metFORMIN (GLUCOPHAGE-XR) 500 MG 24 hr tablet Take 500 mg by mouth daily with breakfast.    . methocarbamol (ROBAXIN) 500 MG tablet Take 500 mg by mouth every 6 (six) hours as needed for muscle spasms.    Marland Kitchen oxyCODONE-acetaminophen (PERCOCET/ROXICET) 5-325 MG tablet Take 1-2 tablets by mouth every 4 (four) hours as needed for severe pain. 20 tablet 0  . Tetrahydroz-Polyvinyl Al-Povid (CLEAR EYES TRIPLE ACTION) 0.05-0.5-0.6 % SOLN Place 2-3 drops into both eyes as needed (for dry eyes).    . valACYclovir (VALTREX) 500 MG tablet Take 1 tablet (500 mg total) by mouth 2 (two) times daily. (Patient not taking: Reported on 09/06/2017) 6 tablet 5    Results for orders placed or performed during the hospital encounter of 09/08/17 (from the past 48 hour(s))  Hemoglobin A1c     Status: None   Collection Time: 09/08/17 10:16 AM  Result Value Ref Range   Hgb A1c MFr Bld 5.2 4.8 - 5.6 %    Comment: (NOTE) Pre diabetes:          5.7%-6.4% Diabetes:              >  6.4% Glycemic control for   <7.0% adults with diabetes    Mean Plasma Glucose 102.54 mg/dL  I-Stat beta hCG blood, ED     Status: None   Collection Time: 09/08/17 11:05 AM  Result Value Ref Range   I-stat hCG, quantitative <5.0 <5 mIU/mL   Comment 3            Comment:   GEST. AGE      CONC.  (mIU/mL)   <=1 WEEK        5 - 50     2 WEEKS       50 - 500     3 WEEKS       100 - 10,000     4 WEEKS     1,000 - 30,000        FEMALE AND NON-PREGNANT FEMALE:     LESS THAN 5 mIU/mL    No results found.  Review of Systems  All other systems reviewed and are negative.   Blood pressure (!) (P) 114/53, pulse (P) 66, temperature 97.9 F (36.6 C), temperature source Oral, resp. rate (P) 16, weight (!) 154.2 kg (340 lb), last menstrual period 08/27/2017, SpO2 100 %. Physical Exam  Constitutional: She is  oriented to person, place, and time. She appears well-developed and well-nourished.  HENT:  Head: Normocephalic and atraumatic.  Neck: Normal range of motion.  Cardiovascular: Normal rate.   Respiratory: Effort normal.  Musculoskeletal:       Right wrist: She exhibits tenderness, bony tenderness and deformity.  Right wrist pain, swelling, and numbness and tingling intermittently in the median distribution.  Neurological: She is alert and oriented to person, place, and time.  Skin: Skin is warm.  Psychiatric: She has a normal mood and affect. Her behavior is normal. Judgment and thought content normal.     Assessment/Plan Assessment is a 36 year old female with a displaced intra-articular fracture right distal radius with carpal tunnel symptoms intermittently. Plan on open reduction and internal fixation of distal radius fracture right side with decompression median nerve right wrist Marlowe Shores, MD 09/08/2017, 12:46 PM

## 2017-09-08 NOTE — Anesthesia Preprocedure Evaluation (Addendum)
Anesthesia Evaluation  Patient identified by MRN, date of birth, ID band Patient awake    Reviewed: Allergy & Precautions, NPO status , Patient's Chart, lab work & pertinent test results  Airway Mallampati: III  TM Distance: >3 FB Neck ROM: Full    Dental no notable dental hx.    Pulmonary neg pulmonary ROS,    Pulmonary exam normal breath sounds clear to auscultation       Cardiovascular negative cardio ROS Normal cardiovascular exam Rhythm:Regular Rate:Normal  ECG: SR, rate 53   Neuro/Psych negative neurological ROS  negative psych ROS   GI/Hepatic Neg liver ROS, GERD (diet related)  Controlled,  Endo/Other  Morbid obesity  Renal/GU negative Renal ROS     Musculoskeletal negative musculoskeletal ROS (+)   Abdominal (+) + obese,   Peds  Hematology negative hematology ROS (+)   Anesthesia Other Findings Super obese  False lashes  Reproductive/Obstetrics                            Anesthesia Physical Anesthesia Plan  ASA: III  Anesthesia Plan: General and Regional   Post-op Pain Management: GA combined w/ Regional for post-op pain   Induction: Intravenous  PONV Risk Score and Plan: 3 and Ondansetron, Dexamethasone and Midazolam  Airway Management Planned: LMA  Additional Equipment:   Intra-op Plan:   Post-operative Plan: Extubation in OR  Informed Consent: I have reviewed the patients History and Physical, chart, labs and discussed the procedure including the risks, benefits and alternatives for the proposed anesthesia with the patient or authorized representative who has indicated his/her understanding and acceptance.   Dental advisory given  Plan Discussed with: CRNA  Anesthesia Plan Comments:         Anesthesia Quick Evaluation

## 2017-09-08 NOTE — Anesthesia Procedure Notes (Signed)
Anesthesia Regional Block: Supraclavicular block   Pre-Anesthetic Checklist: ,, timeout performed, Correct Patient, Correct Site, Correct Laterality, Correct Procedure,, site marked, risks and benefits discussed, Surgical consent,  Pre-op evaluation,  At surgeon's request and post-op pain management  Laterality: Right  Prep: chloraprep       Needles:  Injection technique: Single-shot  Needle Type: Echogenic Stimulator Needle     Needle Length: 9cm  Needle Gauge: 21     Additional Needles:   Procedures:,,,, ultrasound used (permanent image in chart),,,,  Narrative:  Start time: 09/08/2017 12:25 PM End time: 09/08/2017 12:35 PM Injection made incrementally with aspirations every 5 mL.  Performed by: Personally  Anesthesiologist: Karna Christmas P  Additional Notes: Functioning IV was confirmed and monitors were applied.  A 90mm 21ga Arrow echogenic stimulator needle was used. Sterile prep, hand hygiene and sterile gloves were used.  Negative aspiration and negative test dose prior to incremental administration of local anesthetic. The patient tolerated the procedure well.

## 2017-09-11 ENCOUNTER — Encounter (HOSPITAL_COMMUNITY): Payer: Self-pay | Admitting: Orthopedic Surgery

## 2017-09-11 NOTE — Op Note (Signed)
NAME:  Martha Dominguez, Martha Dominguez                 ACCOUNT NO.:  MEDICAL RECORD NO.:  [id number]  LOCATION:                                 FACILITY:  PHYSICIAN:  Duffy Dantonio A. Mina Marble, M.D.   DATE OF BIRTH:  DATE OF PROCEDURE:  09/08/2017 DATE OF DISCHARGE:                              OPERATIVE REPORT   PREOPERATIVE DIAGNOSIS:  Displaced intra-articular 4-part fracture, right distal radius, with right median nerve symptomatology.  POSTOPERATIVE DIAGNOSIS:  Displaced intra-articular 4-part fracture, right distal radius, with right median nerve symptomatology.  PROCEDURE:  Open reduction and internal fixation of displaced intra- articular fracture, 4-part, distal radius, right side with decompression median nerve, right wrist.  SURGEON:  Artist Pais. Mina Marble, MD.  ASSISTANT:  Betha Loa, MD.  ANESTHESIA:  Ax block and general.  COMPLICATIONS:  No complication.  DRAINS:  No drains.  DESCRIPTION OF PROCEDURE:  The patient was taken to the operating suite. After induction of adequate axillary block analgesia and then general laryngeal mask airway anesthetic, the right upper extremity was prepped and draped in usual sterile fashion.  An Esmarch was used to exsanguinate the limb.  Tourniquet was then inflated to 250 mmHg.  At this point in time, an incision was made over the distal aspect of the hand and wrist, distal forearm area of the right side.  The skin was incised sharply.  Dissection was carried down to the flexor carpi radialis tendon.  The sheath overlying the FCR was incised.  The FCR was retracted to the midline, the radial artery to the lateral side.  We carefully incised the fascia and dissected down to the level of pronator quadratus.  We subperiosteally stripped the pronator quadratus off the volar distal radius revealing a 4-part intra-articular fracture of the distal radius on right side with a large dorsal ulnar fragment and a free-floating radial styloid fragment.   Reduction was performed with flexion, ulnar deviation, and traction.  We then placed a standard DVR plate on the volar aspect of the distal radius and using fluoroscopic imaging guidance, determined adequate plate position.  We placed 2 more cortical screws proximally followed by 4 smooth pegs distally and 3 partially-threaded screws into the radial styloid fragment distally. Intraoperative fluoroscopy then revealed adequate reduction with AP, lateral, and oblique view.  At this point in time, we identified the median nerve in the proximal aspect of the wound.  We traced it to the edge of the transverse carpal ligament.  We divided the transverse carpal ligament under direct vision from proximal to distal decompressing the nerve.  The wound was irrigated again, and then the wound was then loosely closed with 2- 0 undyed Vicryl and a 3-0 Prolene subcuticular stitch on the skin. Steri-Strips, 4 x 4's, fluffs, and a compressive dressing and volar splint were applied.  The patient tolerated the procedure well and went to the recovery room in stable fashion.     Artist Pais Mina Marble, M.D.     MAW/MEDQ  D:  09/08/2017  T:  09/08/2017  Job:  [id number]

## 2018-04-10 ENCOUNTER — Emergency Department (HOSPITAL_COMMUNITY)
Admission: EM | Admit: 2018-04-10 | Discharge: 2018-04-10 | Disposition: A | Discharge status: Home / Self Care | Payer: BLUE CROSS/BLUE SHIELD | Attending: Emergency Medicine | Admitting: Emergency Medicine

## 2018-04-10 ENCOUNTER — Emergency Department (HOSPITAL_COMMUNITY): Payer: BLUE CROSS/BLUE SHIELD

## 2018-04-10 ENCOUNTER — Other Ambulatory Visit: Payer: Self-pay

## 2018-04-10 DIAGNOSIS — Z7984 Long term (current) use of oral hypoglycemic drugs: Secondary | ICD-10-CM | POA: Diagnosis not present

## 2018-04-10 DIAGNOSIS — M542 Cervicalgia: Secondary | ICD-10-CM

## 2018-04-10 DIAGNOSIS — M545 Low back pain, unspecified: Secondary | ICD-10-CM

## 2018-04-10 DIAGNOSIS — R1012 Left upper quadrant pain: Secondary | ICD-10-CM

## 2018-04-10 DIAGNOSIS — R0789 Other chest pain: Secondary | ICD-10-CM

## 2018-04-10 DIAGNOSIS — J45909 Unspecified asthma, uncomplicated: Secondary | ICD-10-CM | POA: Insufficient documentation

## 2018-04-10 MED ORDER — CYCLOBENZAPRINE HCL 10 MG PO TABS: 10.0000 mg | ORAL_TABLET | Freq: Two times a day (BID) | ORAL | 0 refills | Status: AC | PRN

## 2018-04-10 MED ORDER — CYCLOBENZAPRINE HCL 10 MG PO TABS
10.0000 mg | ORAL_TABLET | Freq: Once | ORAL | Status: AC
  Administered 2018-04-10: 10 mg via ORAL
  Filled 2018-04-10: qty 1

## 2018-04-10 MED ORDER — ACETAMINOPHEN 500 MG PO TABS
1000.0000 mg | ORAL_TABLET | Freq: Once | ORAL | Status: AC
Start: 2018-04-10 — End: 2018-04-10
  Administered 2018-04-10: 1000 mg via ORAL
  Filled 2018-04-10: qty 2
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
## 2018-04-10 NOTE — Discharge Instructions (Signed)
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
